# Patient Record
Sex: Male | Born: 1955 | Race: White | Hispanic: No | Marital: Married | Smoking: Never smoker
Health system: Southern US, Community
[De-identification: ages and names within clinical notes are randomized; demographics above are authoritative.]

## PROBLEM LIST (undated history)

## (undated) DIAGNOSIS — K219 Gastro-esophageal reflux disease without esophagitis: Secondary | ICD-10-CM

## (undated) DIAGNOSIS — G709 Myoneural disorder, unspecified: Secondary | ICD-10-CM

## (undated) DIAGNOSIS — T7840XA Allergy, unspecified, initial encounter: Secondary | ICD-10-CM

## (undated) HISTORY — PX: OTHER SURGICAL HISTORY: SHX169

## (undated) HISTORY — DX: Gastro-esophageal reflux disease without esophagitis: K21.9

## (undated) HISTORY — DX: Allergy, unspecified, initial encounter: T78.40XA

## (undated) HISTORY — PX: SKIN BIOPSY: SHX1

---
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
## 2013-10-19 DIAGNOSIS — G709 Myoneural disorder, unspecified: Secondary | ICD-10-CM

## 2013-10-19 HISTORY — DX: Myoneural disorder, unspecified: G70.9

## 2019-06-29 ENCOUNTER — Encounter: Payer: Self-pay | Admitting: Internal Medicine

## 2019-07-19 ENCOUNTER — Encounter: Payer: Self-pay | Admitting: Internal Medicine

## 2019-07-21 ENCOUNTER — Other Ambulatory Visit: Payer: Self-pay

## 2019-07-21 ENCOUNTER — Ambulatory Visit (AMBULATORY_SURGERY_CENTER): Payer: Self-pay | Admitting: *Deleted

## 2019-07-21 ENCOUNTER — Encounter (INDEPENDENT_AMBULATORY_CARE_PROVIDER_SITE_OTHER): Payer: Self-pay

## 2019-07-21 VITALS — Temp 97.6°F | Ht 71.0 in | Wt 188.0 lb

## 2019-07-21 DIAGNOSIS — Z1211 Encounter for screening for malignant neoplasm of colon: Secondary | ICD-10-CM

## 2019-07-21 MED ORDER — NA SULFATE-K SULFATE-MG SULF 17.5-3.13-1.6 GM/177ML PO SOLN: 1.0000 | Freq: Once | ORAL | 0 refills | Status: AC

## 2019-07-28 ENCOUNTER — Encounter: Payer: Self-pay | Admitting: Internal Medicine

## 2019-08-03 ENCOUNTER — Telehealth: Payer: Self-pay | Admitting: Internal Medicine

## 2019-08-03 NOTE — Telephone Encounter (Signed)
Pt is scheduled for 08/09/19 colon and reported that wife was exposed to COVID-19--she is under quarantine. Please advise.

## 2019-08-03 NOTE — Telephone Encounter (Signed)
Pt states wife went under quarantine starting yesterday -  She was exposed to a pt that tested positive yesterday for a few hours each day for 2 days- Wife has no symptoms- no fever, no fatigue, no RN or cough or any new s/s-   Pt has a colon 08-09-2019-- he is asking if he should RS? He has no symptoms as well   Please advise if he should RS his colon?  Lelan Pons PV

## 2019-08-04 NOTE — Telephone Encounter (Signed)
His WIFE is being quarantined.  I think as long as he feels well it is safe to continue with plans for his colonoscopy next week.  thanks

## 2019-08-04 NOTE — Telephone Encounter (Signed)
Spoke with the patient. I gave him Dr.Jacob's recommendations. Pt explained that his wife is able to go to work and be around family but is to wear a mask. Patient denies any symptoms of Covid. Pt aware to call us if ANY changes occur with him or his wife.

## 2019-08-07 NOTE — Telephone Encounter (Signed)
Noted. Thanks.

## 2019-08-08 ENCOUNTER — Telehealth: Payer: Self-pay

## 2019-08-08 NOTE — Telephone Encounter (Signed)
Covid-19 screening questions   Do you now or have you had a fever in the last 14 days? NO   Do you have any respiratory symptoms of shortness of breath or cough now or in the last 14 days? NO  Do you have any family members or close contacts with diagnosed or suspected Covid-19 in the past 14 days? NO  Have you been tested for Covid-19 and found to be positive? NO        

## 2019-08-09 ENCOUNTER — Other Ambulatory Visit: Payer: Self-pay

## 2019-08-09 ENCOUNTER — Encounter: Payer: Self-pay | Admitting: Internal Medicine

## 2019-08-09 ENCOUNTER — Ambulatory Visit (AMBULATORY_SURGERY_CENTER): Payer: BC Managed Care – PPO | Admitting: Internal Medicine

## 2019-08-09 VITALS — BP 129/63 | HR 79 | Temp 98.0°F | Resp 19 | Ht 71.0 in | Wt 188.0 lb

## 2019-08-09 DIAGNOSIS — Z1211 Encounter for screening for malignant neoplasm of colon: Secondary | ICD-10-CM | POA: Diagnosis present

## 2019-08-09 HISTORY — PX: COLONOSCOPY: SHX174

## 2019-08-09 MED ORDER — SODIUM CHLORIDE 0.9 % IV SOLN: 500.0000 mL | Freq: Once | INTRAVENOUS | Status: DC

## 2019-08-09 NOTE — Op Note (Signed)
[facility] Patient Name: Dwayne Castillo Procedure Date: 08/09/2019 11:15 AM MRN: [id number] Endoscopist: Wilhemina Bonito. Marina Goodell , MD Age: 63 Referring MD:  Date of Birth: 1956/03/30 Gender: Male Account #: [id number] Procedure:                Colonoscopy Indications:              Screening for colorectal malignant neoplasm.                            Reports negative prior exam elsewhere approximately                            10+ years ago Medicines:                Monitored Anesthesia Care Procedure:                Pre-Anesthesia Assessment:                           - Prior to the procedure, a History and Physical                            was performed, and patient medications and                            allergies were reviewed. The patient's tolerance of                            previous anesthesia was also reviewed. The risks                            and benefits of the procedure and the sedation                            options and risks were discussed with the patient.                            All questions were answered, and informed consent                            was obtained. Prior Anticoagulants: The patient has                            taken no previous anticoagulant or antiplatelet                            agents. ASA Grade Assessment: II - A patient with                            mild systemic disease. After reviewing the risks                            and benefits, the patient was deemed in  satisfactory condition to undergo the procedure.                           After obtaining informed consent, the colonoscope                            was passed under direct vision. Throughout the                            procedure, the patient's blood pressure, pulse, and                            oxygen saturations were monitored continuously. The                            Colonoscope was introduced through the anus and                             advanced to the the cecum, identified by                            appendiceal orifice and ileocecal valve. The                            ileocecal valve, appendiceal orifice, and rectum                            were photographed. The quality of the bowel                            preparation was good. The colonoscopy was performed                            without difficulty. The patient tolerated the                            procedure well. The bowel preparation used was                            supplemental prep followed by SUPREP via split dose                            instruction. Scope In: 11:27:16 AM Scope Out: 11:42:20 AM Scope Withdrawal Time: 0 hours 12 minutes 18 seconds  Total Procedure Duration: 0 hours 15 minutes 4 seconds  Findings:                 Multiple diverticula were found in the left colon.                           Internal hemorrhoids were found during retroflexion.                           The exam was otherwise without abnormality on  direct and retroflexion views. Complications:            No immediate complications. Estimated blood loss:                            None. Estimated Blood Loss:     Estimated blood loss: none. Impression:               - Diverticulosis in the left colon.                           - Internal hemorrhoids.                           - The examination was otherwise normal on direct                            and retroflexion views.                           - No specimens collected. Recommendation:           - Repeat colonoscopy in 10 years for screening                            purposes.                           - Patient has a contact number available for                            emergencies. The signs and symptoms of potential                            delayed complications were discussed with the                            patient. Return to normal  activities tomorrow.                            Written discharge instructions were provided to the                            patient.                           - Resume previous diet.                           - Continue present medications. Wilhemina BonitoJohn N. Marina GoodellPerry, MD 08/09/2019 11:47:53 AM This report has been signed electronically.

## 2019-08-09 NOTE — Progress Notes (Signed)
Pt's states no medical or surgical changes since previsit or office visit.  Temp KA Vitals CW 

## 2019-08-09 NOTE — Patient Instructions (Signed)
YOU HAD AN ENDOSCOPIC PROCEDURE TODAY AT [facility]:   Refer to the procedure report that was given to you for any specific questions about what was found during the examination.  If the procedure report does not answer your questions, please call your gastroenterologist to clarify.  If you requested that your care partner not be given the details of your procedure findings, then the procedure report has been included in a sealed envelope for you to review at your convenience later.  YOU SHOULD EXPECT: Some feelings of bloating in the abdomen. Passage of more gas than usual.  Walking can help get rid of the air that was put into your GI tract during the procedure and reduce the bloating. If you had a lower endoscopy (such as a colonoscopy or flexible sigmoidoscopy) you may notice spotting of blood in your stool or on the toilet paper. If you underwent a bowel prep for your procedure, you may not have a normal bowel movement for a few days.  Please Note:  You might notice some irritation and congestion in your nose or some drainage.  This is from the oxygen used during your procedure.  There is no need for concern and it should clear up in a day or so.  SYMPTOMS TO REPORT IMMEDIATELY:   Following lower endoscopy (colonoscopy or flexible sigmoidoscopy):  Excessive amounts of blood in the stool  Significant tenderness or worsening of abdominal pains  Swelling of the abdomen that is new, acute  Fever of 100F or higher  For urgent or emergent issues, a gastroenterologist can be reached at any hour by calling [phone].  DIET:  We do recommend a small meal at first, but then you may proceed to your regular diet.  Drink plenty of fluids but you should avoid alcoholic beverages for 24 hours.  ACTIVITY:  You should plan to take it easy for the rest of today and you should NOT DRIVE or use heavy machinery until tomorrow (because of the sedation medicines used during the test).     FOLLOW UP: Our staff will call the number listed on your records 48-72 hours following your procedure to check on you and address any questions or concerns that you may have regarding the information given to you following your procedure. If we do not reach you, we will leave a message.  We will attempt to reach you two times.  During this call, we will ask if you have developed any symptoms of COVID 19. If you develop any symptoms (ie: fever, flu-like symptoms, shortness of breath, cough etc.) before then, please call [phone].  If you test positive for Covid 19 in the 2 weeks post procedure, please call and report this information to [place].    SIGNATURES/CONFIDENTIALITY: You and/or your care partner have signed paperwork which will be entered into your electronic medical record.  These signatures attest to the fact that that the information above on your After Visit Summary has been reviewed and is understood.  Full responsibility of the confidentiality of this discharge information lies with you and/or your care-partner.  Please read handouts about diverticulosis, hemorrhoids and high fiber diets  Next colonoscopy- 10 years  Continue your normal medications

## 2019-08-09 NOTE — Progress Notes (Signed)
Report to PACU, RN, vss, BBS= Clear.  

## 2019-08-09 NOTE — Progress Notes (Signed)
1154- pt states he needs to take his scheduled 1100 medications.  Wife and bedside and medications given- Carbidopa-Levidopa 25-100 1.5 tablets given and Rytary 48.75-195 mg po given from pt's home medications.  Dr. Henrene Pastor is aware of this

## 2019-08-11 ENCOUNTER — Telehealth: Payer: Self-pay

## 2019-08-11 NOTE — Telephone Encounter (Signed)
  Follow up Call-  Call back number 08/09/2019  Post procedure Call Back phone  # [phone]  Permission to leave phone message Yes     Patient questions:  Do you have a fever, pain , or abdominal swelling? No. Pain Score  0 *  Have you tolerated food without any problems? Yes  Have you been able to return to your normal activities? Yes.    Do you have any questions about your discharge instructions: Diet   No. Medications  No. Follow up visit  No.  Do you have questions or concerns about your Care? No.  Actions: * If pain score is 4 or above: No action needed, pain <4.  1. Have you developed a fever since your procedure? no  2.   Have you had an respiratory symptoms (SOB or cough) since your procedure? no  3.   Have you tested positive for COVID 19 since your procedure no  4.   Have you had any family members/close contacts diagnosed with the COVID 19 since your procedure?  no   If yes to any of these questions please route to Joylene John, RN and Alphonsa Gin, Therapist, sports.

## 2020-04-15 ENCOUNTER — Other Ambulatory Visit: Payer: Self-pay

## 2020-05-22 ENCOUNTER — Ambulatory Visit: Payer: BC Managed Care – PPO | Admitting: Internal Medicine

## 2020-05-22 ENCOUNTER — Encounter: Payer: Self-pay | Admitting: Internal Medicine

## 2020-05-22 VITALS — BP 130/72 | HR 78 | Ht 71.0 in | Wt 189.5 lb

## 2020-05-22 DIAGNOSIS — K5909 Other constipation: Secondary | ICD-10-CM

## 2020-05-22 DIAGNOSIS — R1084 Generalized abdominal pain: Secondary | ICD-10-CM

## 2020-05-22 DIAGNOSIS — K219 Gastro-esophageal reflux disease without esophagitis: Secondary | ICD-10-CM | POA: Diagnosis not present

## 2020-05-22 NOTE — Patient Instructions (Addendum)
If you are age 62 or older, your body mass index should be between 23-30. Your Body mass index is 26.43 kg/m. If this is out of the aforementioned range listed, please consider follow up with your Primary Care Provider.  If you are age 21 or younger, your body mass index should be between 19-25. Your Body mass index is 26.43 kg/m. If this is out of the aformentioned range listed, please consider follow up with your Primary Care Provider.   Increase your Miralax to achieve 2 bowel movements a day.   You have been scheduled for an abdominal ultrasound at [facility] (1st floor of hospital) on 05-31-2020 at 10am. Please arrive 15 minutes prior to your appointment for registration. Make certain not to have anything to eat or drink 6 hours prior to your appointment. Should you need to reschedule your appointment, please contact radiology at [phone]. This test typically takes about 30 minutes to perform.   It was a pleasure to see you today!  Dr. Marina Goodell

## 2020-05-24 ENCOUNTER — Encounter: Payer: Self-pay | Admitting: Internal Medicine

## 2020-05-24 NOTE — Progress Notes (Signed)
HISTORY OF PRESENT ILLNESS:  Dwayne Castillo is a 64 y.o. 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  Patient has Parkinson's disease for which she is on Sinemet.  He also has a history of GERD for which she is on omeprazole and famotidine.  I last saw the patient August 09, 2019 when he underwent routine colon cancer screening.  Examination revealed internal hemorrhoids and left-sided diverticulosis.  No other abnormalities.  Follow-up in 10 years recommended.  The current history is that of mid abdominal tightness for about 1 years duration.  Describes it as a twisting sensation.  Has occurred daily over this timeframe without significant change.  It does seem to be more noticeable with sitting and less noticeable or relieved with lying or stretching.  Generally lasts an hour or so.  He rates his discomfort a 7 out of 10.  There is no nocturnal component.  It may be more noticeable when he has not eaten in a while and may be less noticeable after eating.  Currently describes his bowel movements once every other day.  Previously he would have 1 or 2 bowel movements every day.  He takes a small dose of MiraLAX and smooth the.  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  He and his wife have been vaccinated against Covid.  His reflux exams are well controlled on his current regimen.  His weight has been stable.  REVIEW OF SYSTEMS:  All non-GI ROS negative unless otherwise stated in the HPI  Past Medical History:  Diagnosis Date  . Allergy   . GERD (gastroesophageal reflux disease)   . Neuromuscular disorder (HCC) 2015   Parkinson's diagnosed 2015    Past Surgical History:  Procedure Laterality Date  . COLONOSCOPY  08/09/2019  . SKIN BIOPSY      Social History Loel Betancur  reports that he has never smoked. He has never used smokeless tobacco. He reports that he does not  drink alcohol and does not use drugs.  family history is not on file.  Allergies  Allergen Reactions  . Erythromycin Rash       PHYSICAL EXAMINATION: Vital signs: BP 130/72   Pulse 78   Ht 5\' 11"  (1.803 m)   Wt 189 lb 8 oz (86 kg)   BMI 26.43 kg/m   Constitutional: generally well-appearing, no acute distress Psychiatric: alert and oriented x3, cooperative Eyes: extraocular movements intact, anicteric, conjunctiva pink Mouth: oral pharynx moist, no lesions Neck: supple no lymphadenopathy Cardiovascular: heart regular rate and rhythm, no murmur Lungs: clear to auscultation bilaterally Abdomen: soft, nontender, nondistended, no obvious ascites, no peritoneal signs, normal bowel sounds, no organomegaly Rectal: Omitted Extremities: no clubbing, cyanosis, or lower extremity edema bilaterally Skin: no lesions on visible extremities Neuro: No focal deficits.  Mild tremor  ASSESSMENT:  1.  1 year history of abdominal discomfort as described.  I suspect secondary to constipation 2.  Constipation.  Likely secondary to medication 3.  Normal colonoscopy except for diverticulosis and hemorrhoids October 2020 4.  GERD.  Controlled on current regimen of PPI and H2 receptor antagonist therapy   PLAN:  1.  Advised them to increase the dose of MiraLAX to achieve the desired result.  Titration technique reviewed. 2.  Schedule abdominal ultrasound to evaluate abdominal pain 3.  Routine office follow-up with me in 6 weeks.  The bowel habits improved but pain persist, consider EGD 4.  Repeat colonoscopy around October 2030 5.  Reflux precautions 6.  Continue reflux medications .  Total time of 30 minutes spent preparing to see the patient, reviewed prior tests and procedures, obtaining history, performing physical exam, educating the patient and his wife regarding his above listed issues, ordering medications and advanced imaging, and documenting clinical information in the health  record

## 2020-05-31 ENCOUNTER — Ambulatory Visit (HOSPITAL_COMMUNITY)
Admission: RE | Admit: 2020-05-31 | Discharge: 2020-05-31 | Disposition: A | Discharge status: Home / Self Care | Payer: BC Managed Care – PPO | Source: Ambulatory Visit | Attending: Internal Medicine | Admitting: Internal Medicine

## 2020-05-31 ENCOUNTER — Other Ambulatory Visit: Payer: Self-pay

## 2020-05-31 DIAGNOSIS — K5909 Other constipation: Secondary | ICD-10-CM | POA: Diagnosis present

## 2020-05-31 DIAGNOSIS — R1084 Generalized abdominal pain: Secondary | ICD-10-CM | POA: Diagnosis not present

## 2020-07-17 ENCOUNTER — Encounter: Payer: Self-pay | Admitting: Internal Medicine

## 2020-07-17 ENCOUNTER — Ambulatory Visit: Payer: BC Managed Care – PPO | Admitting: Internal Medicine

## 2020-07-17 VITALS — BP 110/70 | HR 82 | Ht 71.0 in | Wt 187.3 lb

## 2020-07-17 DIAGNOSIS — R109 Unspecified abdominal pain: Secondary | ICD-10-CM | POA: Diagnosis not present

## 2020-07-17 DIAGNOSIS — K219 Gastro-esophageal reflux disease without esophagitis: Secondary | ICD-10-CM | POA: Diagnosis not present

## 2020-07-17 DIAGNOSIS — K5909 Other constipation: Secondary | ICD-10-CM

## 2020-07-17 NOTE — Patient Instructions (Signed)
You have been scheduled for an endoscopy. Please follow written instructions given to you at your visit today. If you use inhalers (even only as needed), please bring them with you on the day of your procedure.   

## 2020-07-17 NOTE — Progress Notes (Signed)
HISTORY OF PRESENT ILLNESS:  Dwayne Castillo is a 64 y.o. male with Parkinson's disease and GERD who was evaluated May 22, 2020 regarding a 1 year history of abdominal discomfort.  This was felt likely secondary to medication induced constipation.  He did undergo complete colonoscopy October 2020 which revealed diverticulosis and hemorrhoids.  Patient was set up for abdominal ultrasound which was unremarkable.  He was advised with regards to bowel regimen.  Follow-up at this time arranged.  He did pick up MiraLAX.  However, he was given Linzess.  Took 1 dose and had immediate results.  Thereafter he has adjusted his diet and is having regular bowel movements.  Despite this he continues with the same type of abdominal discomfort.  No weight loss or other features.  He states that the pain can be quite severe once or twice per week (8 on a scale of 10).  He does feelmedication related (Sinemet).  He has completed his Covid vaccination series  REVIEW OF SYSTEMS:  All non-GI ROS negative unless otherwise stated in the HPI.  Past Medical History:  Diagnosis Date  . Allergy   . GERD (gastroesophageal reflux disease)   . Neuromuscular disorder (HCC) 2015   Parkinson's diagnosed 2015    Past Surgical History:  Procedure Laterality Date  . COLONOSCOPY  08/09/2019  . SKIN BIOPSY      Social History Dwayne Castillo  reports that he has never smoked. He has never used smokeless tobacco. He reports that he does not drink alcohol and does not use drugs.  family history is not on file.  Allergies  Allergen Reactions  . Erythromycin Rash       PHYSICAL EXAMINATION: Vital signs: BP 110/70 (BP Location: Left Arm, Patient Position: Sitting, Cuff Size: Normal)   Pulse 82   Ht 5\' 11"  (1.803 m)   Wt 187 lb 5 oz (85 kg)   BMI 26.12 kg/m   Constitutional: generally well-appearing, no acute distress Psychiatric: alert and oriented x3, cooperative Eyes: extraocular movements intact, anicteric,  conjunctiva pink Mouth: oral pharynx moist, no lesions Neck: supple no lymphadenopathy Cardiovascular: heart regular rate and rhythm, no murmur Lungs: clear to auscultation bilaterally Abdomen: soft, nontender, nondistended, no obvious ascites, no peritoneal signs, normal bowel sounds, no organomegaly Rectal: Omitted Extremities: no clubbing, cyanosis, or lower extremity edema bilaterally Skin: no lesions on visible extremities Neuro: No focal deficits.  Cranial nerves intact  ASSESSMENT:  1.  Ongoing abdominal pain.  This despite regulation of bowel habits and constipation.  Normal abdominal ultrasound.  May be medication related side effect.  Should rule out upper GI mucosal pathology. 2.  GERD.  On Prilosec OTC 3.  Parkinson's 4.  Constipation improved 5.  Colonoscopy 2020 with diverticulosis and hemorrhoids   PLAN:  1.  Continue current bowel regimen (currently managed with diet.  On-demand MiraLAX available) 2.  Schedule upper endoscopy to further evaluate abdominal pain.The nature of the procedure, as well as the risks, benefits, and alternatives were carefully and thoroughly reviewed with the patient. Ample time for discussion and questions allowed. The patient understood, was satisfied, and agreed to proceed. 3.  Reflux precautions 4.  Continue PPI.  Lowest dose to control symptoms 5.  If upper endoscopy unrevealing, then returned primary provider and specialist for ongoing management of Parkinson's. 6.  Repeat screening colonoscopy around 2030

## 2020-08-07 ENCOUNTER — Other Ambulatory Visit: Payer: Self-pay

## 2020-08-07 ENCOUNTER — Ambulatory Visit (AMBULATORY_SURGERY_CENTER): Payer: BC Managed Care – PPO | Admitting: Internal Medicine

## 2020-08-07 ENCOUNTER — Encounter: Payer: Self-pay | Admitting: Internal Medicine

## 2020-08-07 VITALS — BP 109/63 | HR 75 | Temp 97.7°F | Resp 12 | Ht 71.0 in | Wt 187.0 lb

## 2020-08-07 DIAGNOSIS — R109 Unspecified abdominal pain: Secondary | ICD-10-CM | POA: Diagnosis not present

## 2020-08-07 MED ORDER — SODIUM CHLORIDE 0.9 % IV SOLN: 500.0000 mL | INTRAVENOUS | Status: DC

## 2020-08-07 NOTE — Patient Instructions (Signed)
Please read handouts provided. Continue present medications. Return to care of primary provider.     YOU HAD AN ENDOSCOPIC PROCEDURE TODAY AT THE [facility]:   Refer to the procedure report that was given to you for any specific questions about what was found during the examination.  If the procedure report does not answer your questions, please call your gastroenterologist to clarify.  If you requested that your care partner not be given the details of your procedure findings, then the procedure report has been included in a sealed envelope for you to review at your convenience later.  YOU SHOULD EXPECT: Some feelings of bloating in the abdomen. Passage of more gas than usual.  Walking can help get rid of the air that was put into your GI tract during the procedure and reduce the bloating. If you had a lower endoscopy (such as a colonoscopy or flexible sigmoidoscopy) you may notice spotting of blood in your stool or on the toilet paper. If you underwent a bowel prep for your procedure, you may not have a normal bowel movement for a few days.  Please Note:  You might notice some irritation and congestion in your nose or some drainage.  This is from the oxygen used during your procedure.  There is no need for concern and it should clear up in a day or so.  SYMPTOMS TO REPORT IMMEDIATELY:    Following upper endoscopy (EGD)  Vomiting of blood or coffee ground material  New chest pain or pain under the shoulder blades  Painful or persistently difficult swallowing  New shortness of breath  Fever of 100F or higher  Black, tarry-looking stools  For urgent or emergent issues, a gastroenterologist can be reached at any hour by calling [phone]. Do not use MyChart messaging for urgent concerns.    DIET:  We do recommend a small meal at first, but then you may proceed to your regular diet.  Drink plenty of fluids but you should avoid alcoholic beverages for 24  hours.  ACTIVITY:  You should plan to take it easy for the rest of today and you should NOT DRIVE or use heavy machinery until tomorrow (because of the sedation medicines used during the test).    FOLLOW UP: Our staff will call the number listed on your records 48-72 hours following your procedure to check on you and address any questions or concerns that you may have regarding the information given to you following your procedure. If we do not reach you, we will leave a message.  We will attempt to reach you two times.  During this call, we will ask if you have developed any symptoms of COVID 19. If you develop any symptoms (ie: fever, flu-like symptoms, shortness of breath, cough etc.) before then, please call [phone].  If you test positive for Covid 19 in the 2 weeks post procedure, please call and report this information to us.    If any biopsies were taken you will be contacted by phone or by letter within the next 1-3 weeks.  Please call us at [phone] if you have not heard about the biopsies in 3 weeks.    SIGNATURES/CONFIDENTIALITY: You and/or your care partner have signed paperwork which will be entered into your electronic medical record.  These signatures attest to the fact that that the information above on your After Visit Summary has been reviewed and is understood.  Full responsibility of the confidentiality of this discharge information lies with   with you and/or your care-partner.

## 2020-08-07 NOTE — Op Note (Signed)
[facility] Patient Name: Dwayne Castillo Procedure Date: 08/07/2020 10:25 AM MRN: [id number] Endoscopist: Wilhemina Bonito. Marina Goodell MD, MD Age: 64 Referring MD:  Date of Birth: 1955-11-26 Gender: Male Account #: [id number] Procedure:                Upper GI endoscopy Indications:              Abdominal pain. Intermittent for 6 months.                            Up-to-date on colonoscopy. No alarm features.                            Negative ultrasound. Now for endoscopy Medicines:                Monitored Anesthesia Care Procedure:                Pre-Anesthesia Assessment:                           - Prior to the procedure, a History and Physical                            was performed, and patient medications and                            allergies were reviewed. The patient's tolerance of                            previous anesthesia was also reviewed. The risks                            and benefits of the procedure and the sedation                            options and risks were discussed with the patient.                            All questions were answered, and informed consent                            was obtained. Prior Anticoagulants: The patient has                            taken no previous anticoagulant or antiplatelet                            agents. ASA Grade Assessment: III - A patient with                            severe systemic disease. After reviewing the risks                            and benefits, the patient was deemed in  satisfactory condition to undergo the procedure.                           After obtaining informed consent, the endoscope was                            passed under direct vision. Throughout the                            procedure, the patient's blood pressure, pulse, and                            oxygen saturations were monitored continuously. The                            Endoscope was  introduced through the mouth, and                            advanced to the second part of duodenum. The upper                            GI endoscopy was accomplished without difficulty.                            The patient tolerated the procedure well. Scope In: Scope Out: Findings:                 The esophagus was normal.                           The stomach was normal.                           The examined duodenum was normal.                           The cardia and gastric fundus were normal on                            retroflexion. Complications:            No immediate complications. Estimated Blood Loss:     Estimated blood loss: none. Impression:               1. Normal EGD                           2. No GI cause for abdominal pain found. Recommendation:           - Patient has a contact number available for                            emergencies. The signs and symptoms of potential                            delayed complications were discussed with the  patient. Return to normal activities tomorrow.                            Written discharge instructions were provided to the                            patient.                           - Resume previous diet.                           - Continue present medications.                           -Return to the care of your primary provider Wilhemina Bonito. Marina Goodell MD, MD 08/07/2020 10:39:35 AM This report has been signed electronically.

## 2020-08-07 NOTE — Progress Notes (Signed)
Notified J Monday CRNA of pt's brain stimulator implanted rt chest that is currently on with remote controller in car .

## 2020-08-07 NOTE — Progress Notes (Signed)
Report to PACU, RN, vss, BBS= Clear.  

## 2020-08-07 NOTE — Progress Notes (Signed)
Vs Marko Plume RN in adm

## 2020-08-09 ENCOUNTER — Telehealth: Payer: Self-pay | Admitting: *Deleted

## 2020-08-09 NOTE — Telephone Encounter (Signed)
Left message on f/u call 

## 2020-08-09 NOTE — Telephone Encounter (Signed)
°  Follow up Call-  Call back number 08/07/2020 08/09/2019  Post procedure Call Back phone  # [phone]  Permission to leave phone message Yes Yes     Patient questions:  Do you have a fever, pain , or abdominal swelling? No. Pain Score  0 *  Have you tolerated food without any problems? Yes.    Have you been able to return to your normal activities? Yes.    Do you have any questions about your discharge instructions: Diet   No. Medications  No. Follow up visit  No.  Do you have questions or concerns about your Care? No.  Actions: * If pain score is 4 or above: No action needed, pain <4  1. Have you developed a fever since your procedure? NO  2.   Have you had an respiratory symptoms (SOB or cough) since your procedure? NO  3.   Have you tested positive for COVID 19 since your procedure NO  4.   Have you had any family members/close contacts diagnosed with the COVID 19 since your procedure?  NO   If yes to any of these questions please route to Laverna Peace, RN and Karlton Lemon, RN

## 2020-10-23 IMAGING — US US ABDOMEN COMPLETE
1 series · 14 of 25 positions shown · non-contrast
Comparison: None.

CLINICAL DATA: Abdominal pain for 8 months.  Constipation.

EXAM:
ABDOMEN ULTRASOUND COMPLETE

[Series 1: us abdomen complete · 14 of 119 slices shown]
[im 1/119]
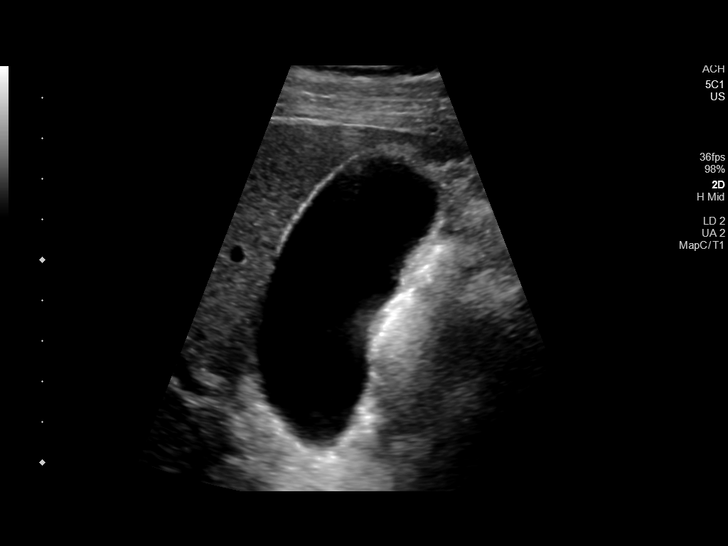
[im 10/119]
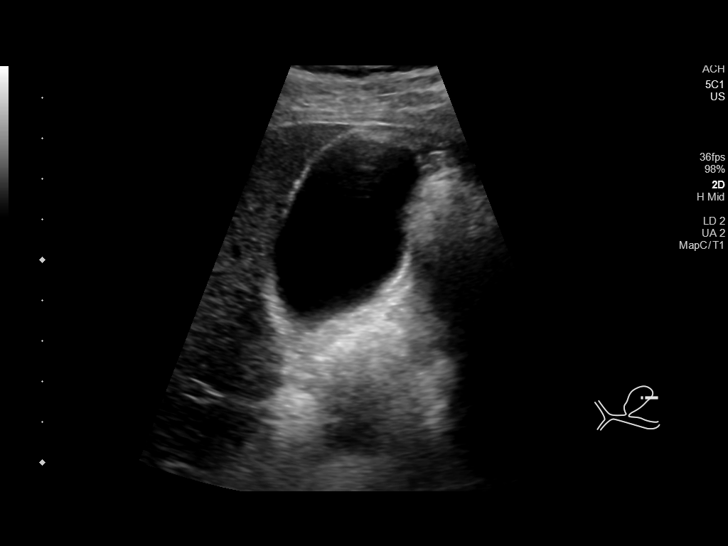
[im 20/119]
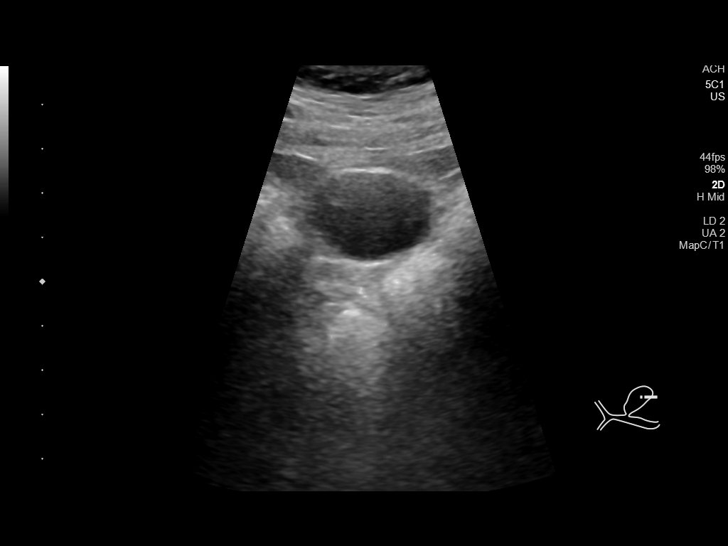
[im 30/119]
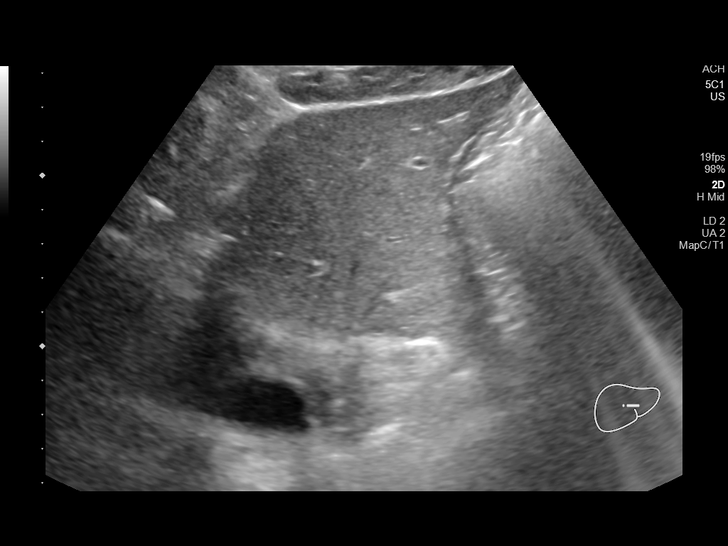
[im 40/119]
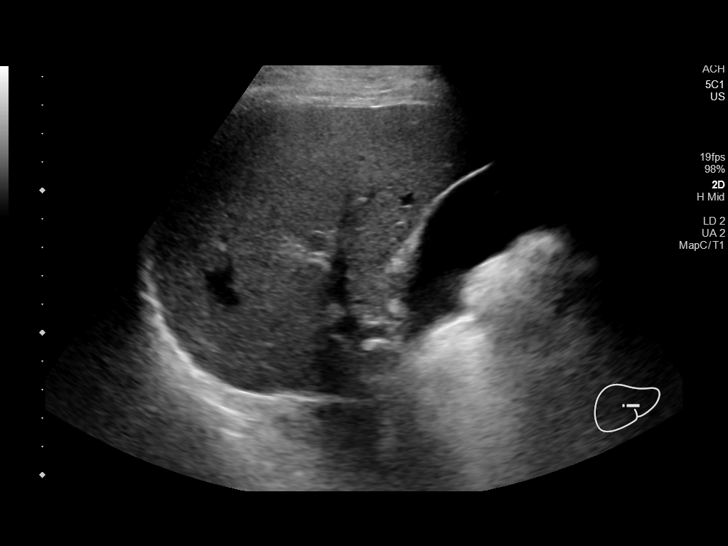
[im 45/119]
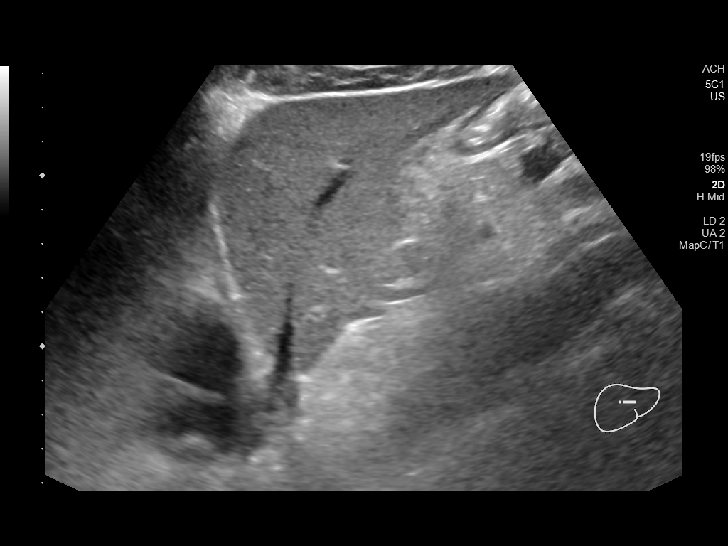
[im 55/119]
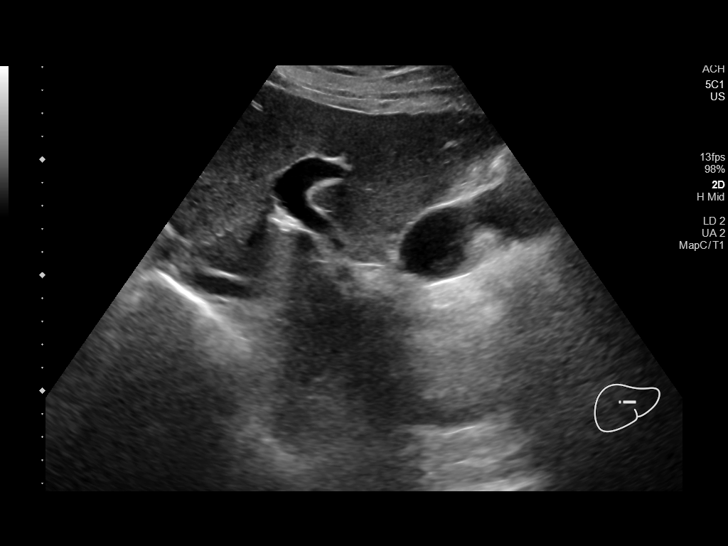
[im 64/119]
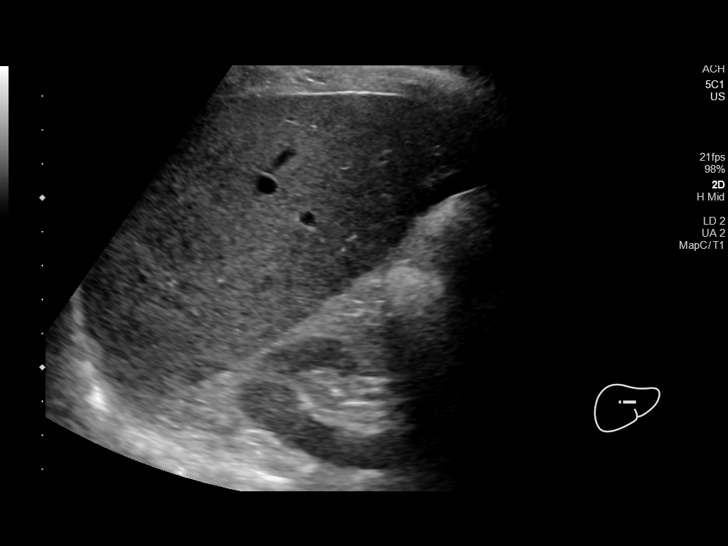
[im 74/119]
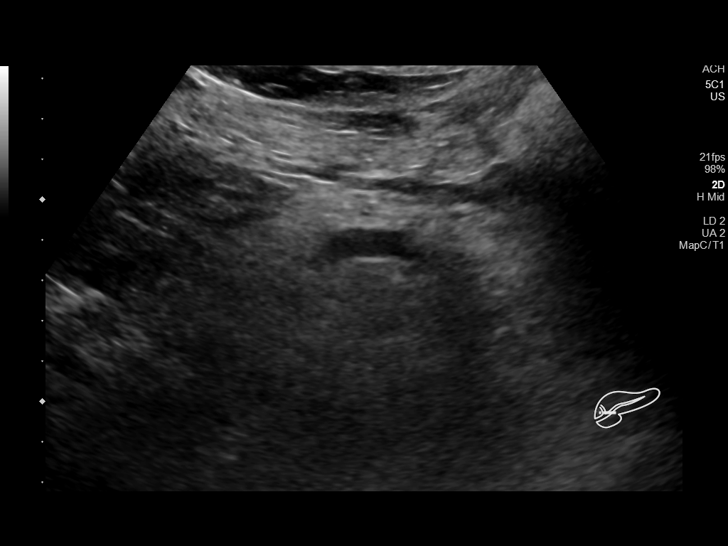
[im 79/119]
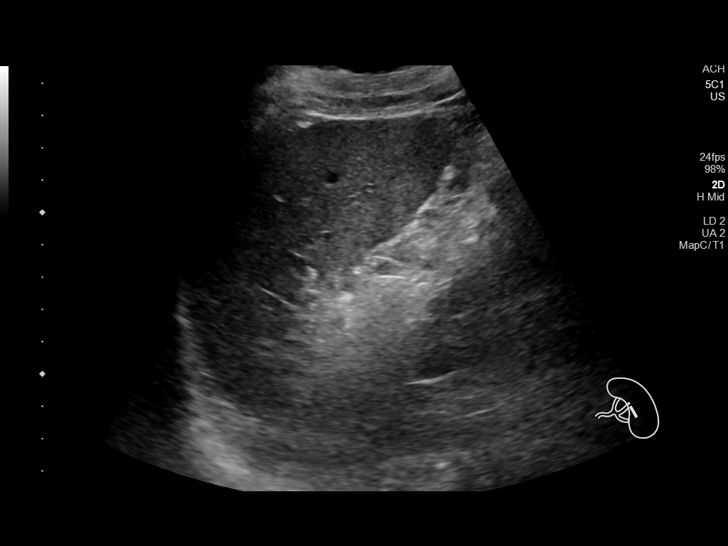
[im 89/119]
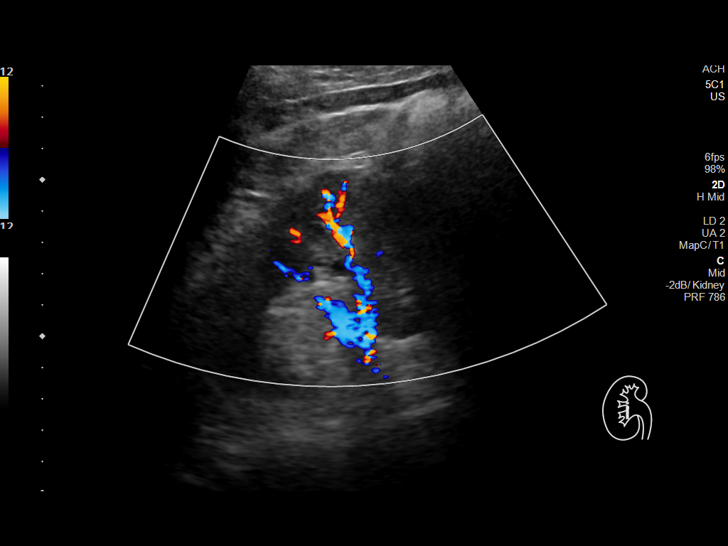
[im 99/119]
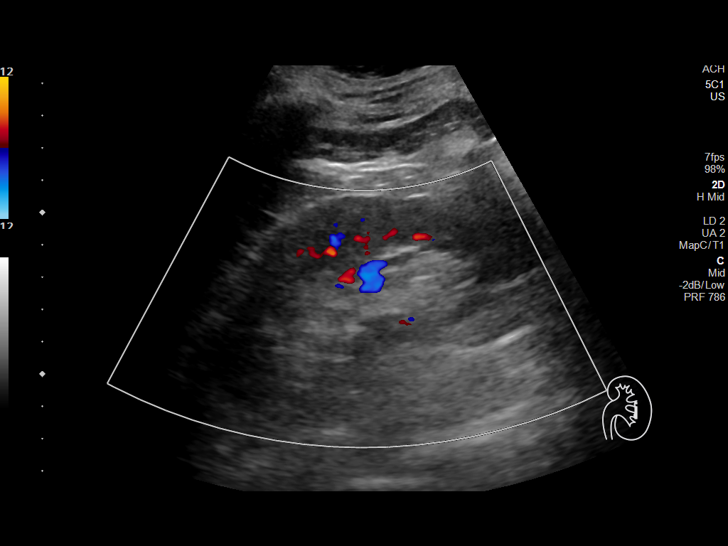
[im 109/119]
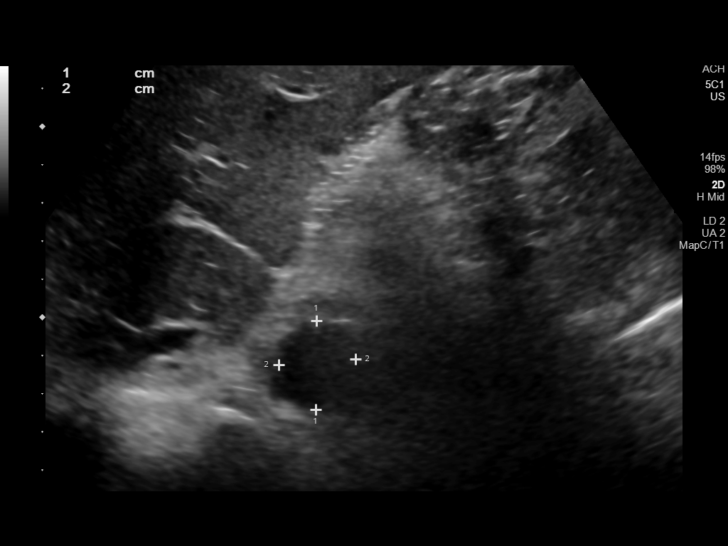
[im 119/119]
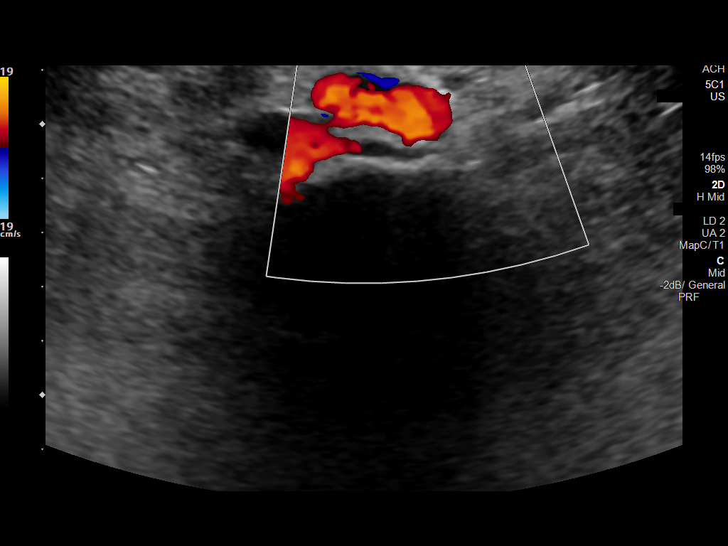

[14 of 25 positions shown; findings below may reference images not displayed]

FINDINGS: Gallbladder: No gallstones or wall thickening visualized. No
sonographic Murphy sign noted by sonographer.

Common bile duct: Diameter: 4 mm

Liver: No focal lesion identified. Within normal limits in
parenchymal echogenicity. Portal vein is patent on color Doppler
imaging with normal direction of blood flow towards the liver.

IVC: No abnormality visualized.

Pancreas: Visualized portion unremarkable.

Spleen: Size and appearance within normal limits.

Right Kidney: Length: 11.6 cm. Echogenicity within normal limits. No
mass or hydronephrosis visualized.

Left Kidney: Length: 12.0 cm. Echogenicity within normal limits. No
mass or hydronephrosis visualized.

Abdominal aorta: No aneurysm visualized.

Other findings: None.
IMPRESSION: Normal abdominal ultrasound.

## 2023-08-25 NOTE — Progress Notes (Signed)
Updated medications

## 2023-09-01 ENCOUNTER — Encounter: Payer: Self-pay | Admitting: Internal Medicine

## 2023-09-01 ENCOUNTER — Ambulatory Visit: Payer: Medicare Other | Attending: Internal Medicine | Admitting: Internal Medicine

## 2023-09-01 VITALS — BP 130/60 | HR 86 | Ht 71.0 in | Wt 181.8 lb

## 2023-09-01 DIAGNOSIS — Z136 Encounter for screening for cardiovascular disorders: Secondary | ICD-10-CM | POA: Diagnosis present

## 2023-09-01 DIAGNOSIS — Z8249 Family history of ischemic heart disease and other diseases of the circulatory system: Secondary | ICD-10-CM | POA: Diagnosis not present

## 2023-09-01 DIAGNOSIS — R0609 Other forms of dyspnea: Secondary | ICD-10-CM | POA: Insufficient documentation

## 2023-09-01 NOTE — Patient Instructions (Signed)
Medication Instructions:  Your physician recommends that you continue on your current medications as directed. Please refer to the Current Medication list given to you today.   Labwork: Lipoprotein-a to be completed today at [facility]  Testing/Procedures: Your physician has requested that you have cardiac CT. Cardiac computed tomography (CT) is a painless test that uses an x-ray machine to take clear, detailed pictures of your heart. For further information please visit [email or web address] Please follow instruction sheet as given.    Follow-Up: Your physician recommends that you schedule a follow-up appointment in: Pending Results  Any Other Special Instructions Will Be Listed Below (If Applicable).  Thank you for choosing [facility]!      If you need a refill on your cardiac medications before your next appointment, please call your pharmacy.

## 2023-09-01 NOTE — Progress Notes (Signed)
Cardiology Office Note  Date: 09/01/2023   ID: Dwayne Castillo, DOB [birth year], MRN [id number]  PCP:  Maximiano Coss, MD  Cardiologist:  Marjo Bicker, MD Electrophysiologist:  None   History of Present Illness: Dwayne Castillo is a 67 y.o. male known to have Parkinson's disease diagnosed in 2015 on medications was referred to cardiology clinic for evaluation of SOB.  Patient has SOB with overexertion activities but no SOB with regular activities, denies having orthopnea or PND.  He sleeps for about 6 to 7 hours daily and wakes up only to go to the bathroom.  No angina, dizziness, presyncope, syncope or leg swelling.  He does have some skipped beats occasionally but does not bother him.  Past Medical History:  Diagnosis Date   Allergy    GERD (gastroesophageal reflux disease)    Neuromuscular disorder (HCC) 2015   Parkinson's diagnosed 2015    Past Surgical History:  Procedure Laterality Date   brain stimulator implant for Parkinsons     COLONOSCOPY  08/09/2019   SKIN BIOPSY      Current Outpatient Medications  Medication Sig Dispense Refill   Ascorbic Acid (VITAMIN C) 1000 MG tablet Take 1,000 mg by mouth daily.     aspirin 81 MG EC tablet Take 81 mg by mouth every morning.     carbidopa-levodopa (SINEMET IR) 25-100 MG tablet Take 25-100 tablets by mouth 5 (five) times daily.     Carbidopa-Levodopa ER (RYTARY) 48.75-195 MG CPCR Take 48.75 mg by mouth 5 (five) times daily.     Cholecalciferol 50 MCG (2000 UT) TABS Take 2,000 Units by mouth daily.     cyanocobalamin (VITAMIN B12) 1000 MCG tablet Take 1,000 mcg by mouth daily.     famotidine (PEPCID) 20 MG tablet Take 20 mg by mouth daily.     Loratadine 10 MG CAPS Take 10 mg by mouth as needed.     omeprazole (PRILOSEC OTC) 20 MG tablet Take 20 mg by mouth every other day.     polyethylene glycol (MIRALAX / GLYCOLAX) 17 g packet Take 17 g by mouth daily.     pyridOXINE (VITAMIN B6) 100 MG tablet Take 100 mg  by mouth daily.     venlafaxine XR (EFFEXOR-XR) 150 MG 24 hr capsule Take 150 mg by mouth daily.     No current facility-administered medications for this visit.   Allergies:  Erythromycin   Social History: The patient  reports that he has never smoked. He has never used smokeless tobacco. He reports that he does not drink alcohol and does not use drugs.   Family History: The patient's family history includes Heart disease in his father; Stroke in his mother.   ROS:  Please see the history of present illness. Otherwise, complete review of systems is positive for none  All other systems are reviewed and negative.   Physical Exam: VS:  BP 130/60   Pulse 86   Ht 5\' 11"  (1.803 m)   Wt 181 lb 12.8 oz (82.5 kg)   SpO2 95%   BMI 25.36 kg/m , BMI Body mass index is 25.36 kg/m.  Wt Readings from Last 3 Encounters:  09/01/23 181 lb 12.8 oz (82.5 kg)  08/07/20 187 lb (84.8 kg)  07/17/20 187 lb 5 oz (85 kg)    General: Patient appears comfortable at rest. HEENT: Conjunctiva and lids normal, oropharynx clear with moist mucosa. Neck: Supple, no elevated JVP or carotid bruits, no thyromegaly. Lungs: Clear to auscultation, nonlabored breathing  at rest. Cardiac: Regular rate and rhythm, no S3 or significant systolic murmur, no pericardial rub. Abdomen: Soft, nontender, no hepatomegaly, bowel sounds present, no guarding or rebound. Extremities: No pitting edema, distal pulses 2+. Skin: Warm and dry. Musculoskeletal: No kyphosis. Neuropsychiatric: Alert and oriented x3, affect grossly appropriate.  Recent Labwork: No results found for requested labs within last 365 days.  No results found for: "CHOL", "TRIG", "HDL", "CHOLHDL", "VLDL", "LDLCALC", "LDLDIRECT"   Assessment and Plan:  Screening of CAD: Patient has symptoms of SOB with overexertion but no symptoms of SOB with regular activities, denies having orthopnea or PND.  No leg swelling.  No angina.  In the absence of symptoms, will  recommend CT calcium scoring of coronaries and lipoprotein a levels.  Recommend heart healthy diet and exercise.  I spent a total duration 30 minutes reviewing the prior medications, labs, notes, discussed prevention of CAD, symptoms of CAD and heart attack and answered all the questions.  Documenting the findings note.     Medication Adjustments/Labs and Tests Ordered: Current medicines are reviewed at length with the patient today.  Concerns regarding medicines are outlined above.    Disposition:  Follow up  pending results  Signed November Sypher Verne Spurr, MD, 09/01/2023 4:25 PM    [facility] [address] [place], [place], [place] [address]

## 2023-09-04 LAB — LIPOPROTEIN A (LPA): Lipoprotein (a): 8.8 nmol/L (ref <75.0)

## 2023-09-08 ENCOUNTER — Ambulatory Visit (HOSPITAL_COMMUNITY)
Admission: RE | Admit: 2023-09-08 | Discharge: 2023-09-08 | Disposition: A | Discharge status: Home / Self Care | Payer: Medicare Other | Source: Ambulatory Visit | Attending: Internal Medicine | Admitting: Internal Medicine

## 2023-09-08 DIAGNOSIS — Z136 Encounter for screening for cardiovascular disorders: Secondary | ICD-10-CM | POA: Insufficient documentation

## 2023-09-15 ENCOUNTER — Other Ambulatory Visit: Payer: Self-pay

## 2023-09-15 ENCOUNTER — Telehealth: Payer: Self-pay

## 2023-09-15 DIAGNOSIS — E785 Hyperlipidemia, unspecified: Secondary | ICD-10-CM

## 2023-09-15 MED ORDER — ROSUVASTATIN CALCIUM 5 MG PO TABS: 5.0000 mg | ORAL_TABLET | Freq: Every day | ORAL | 2 refills | Status: AC

## 2023-09-15 NOTE — Telephone Encounter (Signed)
-----   Message from Vishnu P Mallipeddi sent at 09/15/2023  9:25 AM EST ----- Coronary calcium score is 239 (70th percentile for age and sex matched controls). Continue aspirin 81 mg once daily and start rosuvastatin 5 mg at bedtime. Repeat lipid panel. Goal LDL<70. Schedule follow-up in one year,  Offer telephone visit to discuss results.

## 2023-09-15 NOTE — Telephone Encounter (Signed)
Patient informed and verbalized understanding of plan. 

## 2023-09-21 ENCOUNTER — Telehealth: Payer: Self-pay

## 2023-09-21 LAB — LIPID PANEL
Chol/HDL Ratio: 3.2 {ratio} (ref 0.0–5.0)
Cholesterol, Total: 134 mg/dL (ref 100–199)
HDL: 42 mg/dL (ref >39)
LDL Chol Calc (NIH): 70 mg/dL (ref 0–99)
Triglycerides: 124 mg/dL (ref 0–149)
VLDL Cholesterol Cal: 22 mg/dL (ref 5–40)

## 2023-09-21 NOTE — Telephone Encounter (Signed)
-----   Message from Vishnu P Mallipeddi sent at 09/21/2023 12:16 PM EST ----- LDL at goal, 70.  Continue current statin dose.

## 2023-09-21 NOTE — Telephone Encounter (Signed)
Patient informed and verbalized understanding of plan. 

## 2023-10-06 ENCOUNTER — Telehealth: Payer: Self-pay | Admitting: Orthopedic Surgery

## 2023-10-06 NOTE — Telephone Encounter (Signed)
Spoke w/the pt, he has seen Ortho Dr. Orvan Falconer in [place], had xrays and a MRI at [facility], he will get all that together and bring by for review.

## 2023-10-22 ENCOUNTER — Ambulatory Visit: Payer: BLUE CROSS/BLUE SHIELD | Admitting: Orthopedic Surgery

## 2024-02-24 ENCOUNTER — Encounter: Payer: Self-pay | Admitting: *Deleted

## 2024-02-24 ENCOUNTER — Telehealth: Payer: Self-pay | Admitting: Internal Medicine

## 2024-02-24 NOTE — Telephone Encounter (Signed)
 Will reach out via mychart as we have not received any incoming mychart messages from the patient.

## 2024-02-24 NOTE — Telephone Encounter (Signed)
 Pt sent mychart message stating he's been having -Shortness of breath, starting recently.

## 2024-04-10 ENCOUNTER — Encounter: Payer: Self-pay | Admitting: Internal Medicine

## 2024-04-17 ENCOUNTER — Ambulatory Visit: Admitting: Internal Medicine
